# Patient Record
Sex: Female | Born: 1990 | Race: White | Hispanic: No | Marital: Single
Health system: Southern US, Community
[De-identification: ages and names within clinical notes are randomized; demographics above are authoritative.]

## PROBLEM LIST (undated history)

## (undated) DIAGNOSIS — Z87820 Personal history of traumatic brain injury: Secondary | ICD-10-CM

## (undated) DIAGNOSIS — H509 Unspecified strabismus: Secondary | ICD-10-CM

## (undated) HISTORY — DX: Unspecified strabismus: H50.9

## (undated) HISTORY — PX: STRABISMUS CORRECTION, SURGICAL: SHX001610

## (undated) HISTORY — PX: EXTRACTION, TOOTH: SHX000660

## (undated) HISTORY — DX: Personal history of traumatic brain injury: Z87.820

---
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
## 2018-06-02 ENCOUNTER — Encounter: Payer: Self-pay | Admitting: FAMILY PRACTICE

## 2018-06-02 NOTE — Progress Notes (Signed)
Called patient to initiate Pre Visit Planning.  Three patient identifiers used.  Confirmed patient demographics, added e-mail address. Care everywhere query ran, results received no.  Patient will bring prior PCP information at first appointment if unable to access information through Care everywhere. SOGI, REAL, SBRIT completed verbally over the phone. Medication reconciliation completed and allergies verified.  Preferred pharmacy entered into chart. Patient would like to sign up for [facility] yes. Patient agrees to arrive 20 minutes early to complete new patient questionnaire.      Kinsey Cowsert, LVN

## 2018-06-03 ENCOUNTER — Ambulatory Visit: Payer: 59 | Attending: Family Medicine | Admitting: FAMILY PRACTICE

## 2018-06-03 ENCOUNTER — Ambulatory Visit: Payer: 59

## 2018-06-03 ENCOUNTER — Encounter: Payer: Self-pay | Admitting: FAMILY PRACTICE

## 2018-06-03 VITALS — BP 124/73 | HR 62 | Temp 98.6°F | Resp 16 | Ht 66.5 in | Wt 141.0 lb

## 2018-06-03 DIAGNOSIS — Z23 Encounter for immunization: Secondary | ICD-10-CM | POA: Insufficient documentation

## 2018-06-03 DIAGNOSIS — F40243 Fear of flying: Secondary | ICD-10-CM | POA: Insufficient documentation

## 2018-06-03 DIAGNOSIS — Z0000 Encounter for general adult medical examination without abnormal findings: Principal | ICD-10-CM | POA: Insufficient documentation

## 2018-06-03 DIAGNOSIS — Z3041 Encounter for surveillance of contraceptive pills: Secondary | ICD-10-CM | POA: Insufficient documentation

## 2018-06-03 DIAGNOSIS — Z7689 Persons encountering health services in other specified circumstances: Secondary | ICD-10-CM

## 2018-06-03 DIAGNOSIS — Z832 Family history of diseases of the blood and blood-forming organs and certain disorders involving the immune mechanism: Secondary | ICD-10-CM | POA: Insufficient documentation

## 2018-06-03 LAB — CBC NO DIFFERENTIAL
HEMATOCRIT: 41.8 % (ref 34.0–46.0)
HEMOGLOBIN: 14.3 g/dL (ref 12.0–16.0)
MCH: 32.6 pg (ref 27.0–33.0)
MCHC: 34.2 % (ref 32.0–36.0)
MCV: 95.2 fL (ref 80.0–100.0)
MPV: 10.5 fL — AB (ref 6.8–10.0)
PLATELET COUNT: 168 10*3/uL (ref 130–400)
RDW: 12.9 % (ref 0.0–14.7)
RED CELL COUNT: 4.39 10*6/uL (ref 3.70–5.50)
WHITE BLOOD CELL COUNT: 5.5 10*3/uL (ref 4.5–11.0)

## 2018-06-03 LAB — COMPREHENSIVE METABOLIC PANEL
ALANINE TRANSFERASE (ALT): 18 U/L (ref 5–54)
ALBUMIN: 3.8 g/dL (ref 3.4–4.8)
ALKALINE PHOSPHATASE (ALP): 35 U/L (ref 35–115)
ASPARTATE TRANSAMINASE (AST): 29 U/L (ref 15–43)
BILIRUBIN TOTAL: 0.8 mg/dL (ref 0.3–1.3)
CALCIUM: 9 mg/dL (ref 8.6–10.5)
CARBON DIOXIDE TOTAL: 27 mmol/L (ref 24–32)
CHLORIDE: 108 mmol/L (ref 95–110)
CREATININE BLOOD: 0.78 mg/dL (ref 0.44–1.27)
GLUCOSE: 107 mg/dL — AB (ref 70–99)
POTASSIUM: 3.4 mmol/L (ref 3.3–5.0)
PROTEIN: 6.9 g/dL (ref 6.3–8.3)
SODIUM: 142 mmol/L (ref 135–145)
UREA NITROGEN, BLOOD (BUN): 7 mg/dL — AB (ref 8–22)

## 2018-06-03 MED ORDER — LEVONORGESTREL-ETHINYL ESTRADIOL 0.1 MG-20 MCG TABLET
1.0000 | ORAL_TABLET | Freq: Every day | ORAL | 12 refills | Status: DC
Start: 2018-06-03 — End: 2019-06-05

## 2018-06-03 MED ORDER — ALPRAZOLAM 1 MG TABLET
1.0000 mg | ORAL_TABLET | Freq: Three times a day (TID) | ORAL | 3 refills | Status: DC | PRN
Start: 2018-06-03 — End: 2019-02-27

## 2018-06-03 NOTE — Progress Notes (Signed)
CURES Audit Trail         User Date Status   RAYMOND, CAITLIN  Traven Davids C 06/03/2018  1:18 PM  06/03/2018  1:33 PM Reviewed PDMP [1]  Reviewed PDMP [1]             Prior to prescribing the scheduled med, the CURES database was reviewed by me.    Reviewed note, discussed care, and examined patient with Dr. Marcy Salvo.  Developed plan together and agree with evaluation, assessment, and plan documented in the note.    Andres Ege, MD  Health Sciences Clinical Professor  [facility]  PI # [phone], pgr. # [id number]

## 2018-06-03 NOTE — Nursing Note (Signed)
The Influenza Vaccine VIS document for the flu injection was given to patient to review. Patient or person named in permission has answered no to any history of egg allergy, previous serious reaction to a influenza vaccine or current illness which would preclude them receiving an immunization. Any questions were referred to the physician. The Influenza Vaccine was then administered per protocol or by Policy 2041 using the standing order from Dr. James Kirk (Academic) or Dr. Kurt Slapnik (Network).   The patient was observed for immediate reactions to the vaccine per protocol. None were observed.   Andersson Larrabee, LVN

## 2018-06-03 NOTE — Progress Notes (Signed)
Precepted with Dr. Juel Burrow.    Chief Complaint: establish care    History of Present Illness:  Megan Russell is a 27yr old female presenting to establish care.     Patient recently moved for a job with [employer]. Has a masters in public administration. Lives with Boyfriend at home with pets, no children. She is a runner and frequently runs marathons. She reports that her diet is "trash", but she does report drinking enough water. She denies all substance use including, tobacco, alcohol, marijuana, etc. She reports never using substances. She is sexually active with her boyfriend.     She reports no major medical history. In high school she played soccer and had several concussions. She also reports having eye surgery for strabismus at age 27, and having her wisdom teeth out, but no other surgical history. No complications. She denies chronic medical problems, including allergies, asthma, skin problems, cardiac or respiratory problems, GI problems. 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 She's been in therapy for this in the past and would like to try therapy again.     Her current medications are Sronyx, her birth control, and alprazolam, which she takes for flying (takes 1mg  when flying and travels regularly for work). Her menses are regular, no breakthrough bleeding on current birth control.     She has seen a doctor regularly and reports being UTD on pap smears. She is not sure if she is UTD on her Tdap, but does request a flu shot today.     Family history includes a blood disease in her father for which he had his spleen removed. She thinks this is a problem with hemoglobin, and notes that her dad has his blood removed on a regular. She was tested for this in elementary school but was negative.     Review of Systems   All other systems reviewed and are negative.    Past Medical History:   Diagnosis Date    History of concussion     Strabismus      Past Surgical History:   Procedure Laterality Date     EXTRACTION, TOOTH Bilateral     Wisdom teeth removed    STRABISMUS CORRECTION, SURGICAL Right        Social History     Tobacco Use    Smoking status: Never Smoker    Smokeless tobacco: Never Used   Substance Use Topics    Alcohol use: Never     Frequency: Never        Current Outpatient Medications   Medication Sig    Alprazolam (XANAX) 1 mg Tablet Take 1 tablet by mouth three times daily if needed for anxiety.    Levonorgestrel 0.1 mg/Ethinyl Estradiol 20 mcg (SRONYX) Tablet Take 1 tablet by mouth every day.     No current facility-administered medications for this visit.         Allergies as of 06/03/2018    (No Known Allergies)        OBJECTIVE:   BP 124/73 (SITE: right arm, Orthostatic Position: sitting, Cuff Size: regular)   Pulse 62   Temp 37 C (98.6 F) (Oral)   Resp 16   Ht 1.689 m (5' 6.5")   Wt 64 kg (141 lb)   LMP 05/20/2018 (Exact Date)   SpO2 98%   BMI 22.42 kg/m    General: Overall well-appearing, NAD.   HEENT: EOMI. PERRL. MMM.  Neck: Trachea midline, Supple, no masses.   CV: Regular  rate and rhythm. Normal S1, S2. No murmurs, rubs, gallops.  Resp: Normal respiratory effort. CTAB, without wheezes or rhonchi.    GI: + BS. Soft. NTND No pulsatile masses.  No abdominal bruits. No hepatosplenomegaly on palpation.   Extremities: Warm and well perfused. No edema.  Skin: No rashes or ulcerations visible on face, torso, abdomen, or extremities.   Neuro: CN II-XII intact. Strength and sensation to light touch intact grossly throughout.  Mental Status: Alert and oriented x 4.  Linear thought process. Judgement intact. Affect normal. Behavior normal.     ASSESSMENT and PLAN:  1. Establish care  2. Routine health maintenance  No chronic medical issues to address today, other than her anxiety while flying (see below). Her medications are appropriate and I gave her refills of them today. I will have her return to clinic in 1 month for an annual physical exam when her records from prior clinics are  in and I can better assess what immunizations and screenings she needs. Given her potential family history of hemochromatosis I will order a CBC and CMP today to assess baseline.   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 Current regimen is reasonable and I encouraged her to follow up with a local therapist. Instructions for finding therapy provided in AVS.   - Patient to call insurance for therapy  - Alprazolam 15 tablets 3 refills    I reviewed the patient's past medical and family/social history, and updated in chart.  Barriers to Learning assessed: none. Patient verbalizes understanding of teaching and instructions.  Education Needs Identified?  Yes- as above      I spent 40 minutes with the patient, >50% of which were spent counseling her on diagnostic results/impressions, recommendations for further studies, treatment plan and risks/benefits of management, follow-up and return precautions, importance of compliance with chosen management options and risk factor reduction, as well as answering patient questions.    Alleen Borne, MD/PhD  Family Medicine, PGY-1  [facility]  Pager: [phone]

## 2018-06-03 NOTE — Nursing Note (Signed)
Reviewed all orders with the patient including lab work, tests, referrals and/or medications ordered.   Per provider, follow up: Needed  Appointment: Scheduled  AVS: Printed for patient in clinic  Spent 2 minutes discharging patient.     Creston Klas, MA

## 2018-06-03 NOTE — Nursing Note (Signed)
Temp: 37 C (98.6 F) (10/18 1251)  Temp src: Oral (10/18 1251)  Pulse: 62 (10/18 1251)  BP: 124/73 (10/18 1251)  Resp: 16 (10/18 1251)  SpO2: 98 % (10/18 1251)  Height: 168.9 cm (5' 6.5") (10/18 1251)  Weight: 64 kg (141 lb) (10/18 1251)    Vital signs taken, chief complaint noted, allergies verified and screened for pain. Mobility screened for patients over the age of 16. Tobacco use reviewed and preferred pharmacy verified. Medication reconciliation initiated with printed list of current medications to be reviewed and edited by the patient.     Lavell Luster, MA  Family Medicine Clinic, [facility]  [phone]

## 2018-06-03 NOTE — Patient Instructions (Signed)
Patient instructions for mental health (private insurance, also mild-moderate severity mental illness with Medi-Cal)  Please call the phone number on the back of your insurance card to request mental health services (psychiatry/medications and/or therapy/counseling). You can cross-reference the names you receive with people on [email or web address] (a non-commercial website where providers can self-identify areas of specialty, demographics, insurance, etc.). Plan to call multiple providers to find people who are accepting new patients or who can see you in a timely manner. It is common for psychiatrists (medical doctors who prescribe medication) to not be able to see patients for several weeks to months due to limited availability. If you find someone who can get you scheduled, even if it is for weeks to months later, we recommend you schedule an appointment with a plan to cancel the appointment if you are able to find another option sooner. Therapists have widely varying availability--some may be available within a week, others may not be able to schedule for several weeks. Many of these providers (both therapists and psychiatrists) who you call may not call you back, so be prepared to call and leave messages for multiple providers.

## 2018-06-08 ENCOUNTER — Ambulatory Visit: Payer: 59 | Attending: FAMILY PRACTICE | Admitting: FAMILY PRACTICE

## 2018-06-08 VITALS — BP 121/84 | HR 71 | Temp 97.4°F | Resp 13 | Ht 66.0 in | Wt 137.0 lb

## 2018-06-08 DIAGNOSIS — R0981 Nasal congestion: Principal | ICD-10-CM | POA: Insufficient documentation

## 2018-06-08 DIAGNOSIS — J0111 Acute recurrent frontal sinusitis: Secondary | ICD-10-CM

## 2018-06-08 MED ORDER — AMOXICILLIN 875 MG-POTASSIUM CLAVULANATE 125 MG TABLET
1.0000 | ORAL_TABLET | Freq: Two times a day (BID) | ORAL | 0 refills | Status: AC
Start: 2018-06-08 — End: 2018-06-15

## 2018-06-08 MED ORDER — FLUTICASONE PROPIONATE 50 MCG/ACTUATION NASAL SPRAY,SUSPENSION
1.0000 | Freq: Every day | NASAL | 5 refills | Status: AC
Start: 2018-06-08 — End: 2019-07-17

## 2018-06-08 MED ORDER — CETIRIZINE 10 MG TABLET
10.0000 mg | ORAL_TABLET | Freq: Every day | ORAL | 5 refills | Status: AC | PRN
Start: 2018-06-08 — End: 2019-07-17

## 2018-06-08 NOTE — Progress Notes (Signed)
Precepted with Dr. Hetty Blend.    Chief Complaint: sinus congestion    History of Present Illness:  Megan Russell is a 27yr old female with no significant PMH presenting with sinus congestion.     Since Sunday she has had sinus congestion and pressure, combined with facial pain and tooth pain. She notes that she typically has these symptoms in the fall and spring. She's tried sudafed and zyrtec, but without much difference. She did have a sore throat on Sunday, but this has resolved now. No fevers or chills, but the pain keeps her awake at night. Notably, she is feeling fatigued to the point that she was sent home from work today.       Review of Systems   All other systems reviewed and are negative.      There are no active problems to display for this patient.      Past Medical History:   Diagnosis Date    History of concussion     Strabismus         No family history on file.     Social History     Tobacco Use    Smoking status: Never Smoker    Smokeless tobacco: Never Used   Substance Use Topics    Alcohol use: Never     Frequency: Never        Current Outpatient Medications   Medication Sig    Alprazolam (XANAX) 1 mg Tablet Take 1 tablet by mouth three times daily if needed for anxiety.    Levonorgestrel 0.1 mg/Ethinyl Estradiol 20 mcg (SRONYX) Tablet Take 1 tablet by mouth every day.     No current facility-administered medications for this visit.         Allergies as of 06/08/2018    (No Known Allergies)        OBJECTIVE:   BP 121/84 (SITE: left arm, Orthostatic Position: sitting, Cuff Size: regular)   Pulse 71   Temp 36.3 C (97.4 F)   Resp 13   Ht 1.676 m (5\' 6" )   Wt 62.1 kg (137 lb)   LMP 05/20/2018 (Exact Date)   SpO2 98%   BMI 22.11 kg/m    General: Overall well-appearing, laying in bed, NAD.   HEENT: EOMI. PERRL. MMM. Nasal turbinates pale with mucous in both nasal passages. Oropharynx erythematous with active post nasal drip. Tympanic membranes grey, transparent, and in neutral position  bilaterally. Facial tenderness elicited along the frontal sinuses bilaterally to percussion.   Neck: Trachea midline, Supple, no JVD, No carotid bruits, No lymphadenopathy, thyroid not enlarged, no masses.   CV: Regular rate and rhythm. Normal S1, S2. No murmurs, rubs, gallops.  Resp: Normal respiratory effort. CTAB, without wheezes or rhonchi.    GI: + BS. Soft. NTND No pulsatile masses.  No abdominal bruits. No hepatosplenomegaly on palpation.   Extremities: Warm and well perfused. No edema. Radial pulses 2+ bilaterally. DP pulses 2+ bilaterally.   Skin: No rashes or ulcerations visible on face, torso, abdomen, or extremities.   Neuro: CN II-XII intact. Strength and sensation to light touch intact grossly throughout.  Mental Status: Alert and oriented x 4.  Linear thought process. Judgement intact. Affect normal. Behavior normal.     ASSESSMENT and PLAN:  1. Sinus congestion  Consistent with a sinus infection. No fever, and symptoms have only been present for 4 days, but I am strongly inclined to treat with antibiotics given the severity of pain. Differential does include allergies and  viral sinusitis as well as bacterial sinusitis, or some combination thereof. Patient does endorse a history of allergies so I will treat for this as well.   - Flonase 1-2 sprays in each nostril daily  - Zyrtec 10mg  daily  - Augmentin 875mg  BID for 7 days  - F/u if symptoms do not improve    I did review patient's past medical and family/social history, no changes noted.  Barriers to Learning assessed: none. Patient verbalizes understanding of teaching and instructions.  Education Needs Identified?  Yes- as above    I spent 30 minutes with the patient, >50% of which were spent counseling her on diagnostic results/impressions, recommendations for further studies, treatment plan and risks/benefits of management, follow-up and return precautions, importance of compliance with chosen management options and risk factor reduction, as well  as answering patient questions.    Alleen Borne, MD/PhD  Family Medicine, PGY-1  [facility]  Pager: [phone]

## 2018-06-08 NOTE — Patient Instructions (Addendum)
Sinusitis: Care Instructions  Your Care Instructions     Sinusitis is an infection of the lining of the sinus cavities in your head. Sinusitis often follows a cold. It causes pain and pressure in your head and face.  In most cases, sinusitis gets better on its own in 1 to 2 weeks. But some mild symptoms may last for several weeks. Sometimes antibiotics are needed.  Follow-up care is a key part of your treatment and safety. Be sure to make and go to all appointments, and call your doctor if you are having problems. It's also a good idea to know your test results and keep a list of the medicines you take.  How can you care for yourself at home?   Take an over-the-counter pain medicine, such as acetaminophen (Tylenol), ibuprofen (Advil, Motrin), or naproxen (Aleve). Read and follow all instructions on the label.   If the doctor prescribed antibiotics, take them as directed. Do not stop taking them just because you feel better. You need to take the full course of antibiotics.   Be careful when taking over-the-counter cold or flu medicines and Tylenol at the same time. Many of these medicines have acetaminophen, which is Tylenol. Read the labels to make sure that you are not taking more than the recommended dose. Too much acetaminophen (Tylenol) can be harmful.   Breathe warm, moist air from a steamy shower, a hot bath, or a sink filled with hot water. Avoid cold, dry air. Using a humidifier in your home may help. Follow the directions for cleaning the machine.   Use saline (saltwater) nasal washes to help keep your nasal passages open and wash out mucus and bacteria. You can buy saline nose drops at a grocery store or drugstore. Or you can make your own at home by adding 1 teaspoon of salt and 1 teaspoon of baking soda to 2 cups of distilled water. If you make your own, fill a bulb syringe with the solution, insert the tip into your nostril, and squeeze gently. Blow your nose.   Put a hot, wet towel or a warm  gel pack on your face 3 or 4 times a day for 5 to 10 minutes each time.   Try a decongestant nasal spray like oxymetazoline (Afrin). Do not use it for more than 3 days in a row. Using it for more than 3 days can make your congestion worse.  When should you call for help?  Call your doctor now or seek immediate medical care if:   You have new or worse swelling or redness in your face or around your eyes.   You have a new or higher fever.  Watch closely for changes in your health, and be sure to contact your doctor if:   You have new or worse facial pain.   The mucus from your nose becomes thicker (like pus) or has new blood in it.   You are not getting better as expected.   Where can you learn more?   Go to https://www.healthwise.net/patiented  Enter I933 in the search box to learn more about "Sinusitis: Care Instructions."    2006-2015 [employer]. Care instructions adapted under license by [facility]. This care instruction is for use with your licensed healthcare professional. If you have questions about a medical condition or this instruction, always ask your healthcare professional. Healthwise, Incorporated disclaims any warranty or liability for your use of this information.  Content Version: 10.6.465758; Current as of: June 30, 2013            For same day urgent needs: please be aware during business hours Monday through Friday from 8am to 5pm we encourage you to contact us at [phone]   to schedule an appointment. If we are unable to see you, you may also have access to an advice nurse 24 hours a day and urgent care services through   your insurance provider. If you have an HMO we will work with the urgent care center you go to for PCP authorization and if you have any other kind of insurance   you can go straight to an urgent care center without PCP authorization. Please call or go on-line with your insurance provider and ask about their advice nurse services   and  to get a list of authorized Urgent Care centers.     After business hours, evenings, and weekends you may call our clinic [phone] and the hospital operator will contact our department's on call doctor to   speak with you. You can also use your insurance provider advice nurse services or go to one of your insurance providers authorized urgent care centers.     Call 911 or go to the nearest Emergency room for life threatening emergencies.       Lavell Luster, MA II  Family Medicine Clinic  [phone]

## 2018-06-08 NOTE — Progress Notes (Signed)
This patient was seen, evaluated, and care plan was developed with the resident.  I agree with the assessment and plan as outlined in the resident's note.    Report Electronically Signed by:  Amoree Newlon, MD  Professor  Department of Family & Community Medicine  PI # [id number]  Pager [phone]

## 2018-06-08 NOTE — Nursing Note (Signed)
Temp: 36.3 C (97.4 F) (10/23 1408)  Temp src: --  Pulse: 71 (10/23 1408)  BP: 121/84 (10/23 1408)  Resp: 13 (10/23 1408)  SpO2: 98 % (10/23 1408)  Height: 167.6 cm (5\' 6" ) (10/23 1408)  Weight: 62.1 kg (137 lb) (10/23 1408)    Vital signs taken, chief complaint noted, allergies verified and screened for pain. Mobility screened for patients over the age of 7. Tobacco use reviewed and preferred pharmacy verified. Medication reconciliation initiated with printed list of current medications to be reviewed and edited by the patient.     Vernia Buff, MA  Family Medicine Clinic, [facility]  [phone]

## 2018-06-08 NOTE — Nursing Note (Signed)
Reviewed all orders with the patient including lab work, tests, referrals and/or medications ordered. Pharmacy verified once again. Scheduled a follow up appointment. Printed and gave AVS to patient. Spent 1 minutes discharging patient.     Vanesha Athens, MA II  Family Medicine Clinic  [phone]

## 2018-06-09 MED FILL — flu vaccine qs 2019-20(6mos up)(PF) 60 mcg(15 mcgx4)/0.5 mL IM syringe: INTRAMUSCULAR | Qty: 0.5 | Fill #0 | Status: AC

## 2018-07-05 ENCOUNTER — Ambulatory Visit: Payer: 59 | Attending: Family Medicine

## 2018-07-05 ENCOUNTER — Encounter: Payer: Self-pay | Admitting: FAMILY PRACTICE

## 2018-07-05 ENCOUNTER — Ambulatory Visit: Payer: 59 | Attending: Family Medicine | Admitting: FAMILY PRACTICE

## 2018-07-05 VITALS — BP 120/82 | HR 61 | Temp 97.8°F | Resp 16 | Ht 66.0 in | Wt 137.8 lb

## 2018-07-05 DIAGNOSIS — Z01419 Encounter for gynecological examination (general) (routine) without abnormal findings: Secondary | ICD-10-CM | POA: Insufficient documentation

## 2018-07-05 DIAGNOSIS — J302 Other seasonal allergic rhinitis: Secondary | ICD-10-CM | POA: Insufficient documentation

## 2018-07-05 DIAGNOSIS — Z0000 Encounter for general adult medical examination without abnormal findings: Principal | ICD-10-CM | POA: Insufficient documentation

## 2018-07-05 LAB — HIV AG/AB COMBO SCREEN: HIV AG/AB COMBO SCREEN: NONREACTIVE

## 2018-07-05 NOTE — Nursing Note (Signed)
Set up gyn tray and assisted provider with pelvic exam.  Obtained specimens for PAP and GC/Chlamydia. Specimens sent to lab.

## 2018-07-05 NOTE — Nursing Note (Signed)
After visit summary was print out for pt. Went over encounter summary with patient made sure if any fasting labs (ABN that needed to be ran), medication refills, referrals and radiology/ultrasound made sure patient was aware of it. Spent a total of 5 minutes with patient. Jaylon Grode MA II

## 2018-07-05 NOTE — Progress Notes (Signed)
Precepted with Dr. Johna SheriffSharon George.    Chief Complaint: annual physical    History of Present Illness:  Megan Russell is a [age]yr old female with PMH of seasonal allergies otherwise in good health presenting for her annual physical exam. Megan Russell recently transferred care to this facility and records from her old provider do not indicate the date of her last pap smear. She had her flu shot on 06/03/18 at this facility.     Patient states that her last pap smear was in March or April of this year and it was negative. She believes she had the HPV vaccine when she was an adolescent. She is unclear on whether her insurance will cover a second pap smear this year, but she would prefer to have a pap smear done today.     She agrees to STI screening. She has no other complaints.     Review of Systems   All other systems reviewed and are negative.      Patient Active Problem List    Diagnosis Date Noted    Seasonal allergies 07/05/2018       Past Medical History:   Diagnosis Date    History of concussion     Strabismus         No family history on file.     Social History     Tobacco Use    Smoking status: Never Smoker    Smokeless tobacco: Never Used   Substance Use Topics    Alcohol use: Never     Frequency: Never        Current Outpatient Medications   Medication Sig    Cetirizine (ZYRTEC) 10 mg Tablet Take 1 tablet by mouth once daily if needed for Allergies.    Fluticasone (FLONASE) 50 mcg/actuation nasal spray Instill 1-2 sprays into EACH nostril every day. (allergies)    Levonorgestrel 0.1 mg/Ethinyl Estradiol 20 mcg (SRONYX) Tablet Take 1 tablet by mouth every day.     No current facility-administered medications for this visit.         Allergies as of 07/05/2018    (No Known Allergies)        OBJECTIVE:   BP 120/82 (SITE: right arm, Orthostatic Position: sitting, Cuff Size: regular)   Pulse 61   Temp 36.6 C (97.8 F) (Tympanic)   Resp 16   Ht 1.676 m (5\' 6" )   Wt 62.5 kg (137 lb 12.8 oz)   LMP 07/04/2018    SpO2 97%   BMI 22.24 kg/m    General: Overall well-appearing, laying in bed, NAD.   HEENT: EOMI. PERRL. MMM.  Neck: Trachea midline, Supple, no JVD, No carotid bruits, No lymphadenopathy, thyroid not enlarged, no masses.   CV: Regular rate and rhythm. Normal S1, S2. No murmurs, rubs, gallops.  Resp: Normal respiratory effort. CTAB, without wheezes or rhonchi.    GI: + BS. Soft. NTND. No pulsatile masses.  No abdominal bruits. No hepatosplenomegaly on palpation.   GU:    External genitalia: normal   Uterus: firm, non tender   Adnexa: no masses, non-tender   Cervix: smooth without lesions, a small amount of blood a mucous present.    Vagina: vaginal walls without lesions or erythema, scattered menstrual blood and mucous.   Extremities: Warm and well perfused. No edema.   Skin: No rashes or ulcerations visible on face, torso, abdomen, or extremities.   Neuro: CN II-XII intact. Strength and sensation to light touch intact grossly throughout.  Mental Status:  Alert and oriented x 4.  Linear thought process. Judgement intact. Affect normal. Behavior normal.     ASSESSMENT and PLAN:  Megan Russell is a 27 yoF in good health with PMH of seaonal allergies presenting for her annual physical exam.     #(Z00.00) Routine health maintenance  (primary encounter diagnosis)  Comment: Healthy 27 year old in need of pap smear, UTD on vaccinations, agrees to STI screening. No need for colonoscopy or mammogram, blood pressure screening wnl. Prior PHQ9 was 0 and today's GAD7 was 7.  Encouraged patient to follow up with therapy.   Plan:   - pap smear with gyn cytology, no HPV co-testing  - HIV screening  - syphilis screening  - chlamydia/gonorrhea screening  - f/u with therapist  - F/u in 1 year for annual physical exam    I did review patient's past medical and family/social history, no changes noted.  Barriers to Learning assessed: none. Patient verbalizes understanding of teaching and instructions.  Education Needs Identified?  Yes-  as above    I spent 30 minutes with the patient, >50% of which were spent counseling her on diagnostic results/impressions, recommendations for further studies, treatment plan and risks/benefits of management, follow-up and return precautions, importance of compliance with chosen management options and risk factor reduction, as well as answering patient questions.    Alleen Borne, MD/PhD  Family Medicine, PGY-1  [facility]  Pager: [phone]

## 2018-07-05 NOTE — Progress Notes (Signed)
This patient was seen, evaluated, and care plan was developed with the resident.  I agree with the assessment and plan as outlined in the resident's note.  Report electronically signed by Aziza Stuckert McCoy Yittel Emrich, MD. Attending

## 2018-07-05 NOTE — Nursing Note (Signed)
Temp: 36.6 C (97.8 F) (11/19 1504)  Temp src: Tympanic (11/19 1504)  Pulse: 61 (11/19 1504)  BP: 120/82 (11/19 1504)  Resp: 16 (11/19 1504)  SpO2: 97 % (11/19 1504)  Height: 167.6 cm (5\' 6" ) (11/19 1504)  Weight: 62.5 kg (137 lb 12.8 oz) (11/19 1504)    Vital signs taken, chief complaint noted, allergies verified and screened for pain. Mobility screened for patients over the age of 28. Tobacco use reviewed and preferred pharmacy verified. Medication reconciliation initiated with printed list of current medications to be reviewed and edited by the patient.     Wayland SalinasJaskaran Laylee Schooley, MA  Family Medicine Clinic, [facility]  [phone]

## 2018-07-06 LAB — SYPHILIS IGG/IGM AB WITH REFLEX: SYPHILIS IGG/IGM AB WITH REFLEX: NONREACTIVE

## 2018-07-07 LAB — CHLAMYDIA/GONORRHOEAE SWAB
CHLAMYDIA TRACHOMATIS SWAB: NEGATIVE
NEISSERIA GONORRHOEAE SWAB: NEGATIVE

## 2018-07-11 LAB — CYTOLOGY GYN
~~LOC~~ AP GYN INTERPRETATION: NEGATIVE
~~LOC~~ AP GYN SPECIMEN ADEQUACY: ABSENT

## 2018-07-12 ENCOUNTER — Encounter: Payer: Self-pay | Admitting: FAMILY PRACTICE

## 2018-07-13 ENCOUNTER — Telehealth: Payer: Self-pay | Admitting: FAMILY PRACTICE

## 2018-07-13 NOTE — Telephone Encounter (Addendum)
Called patient to ask that she have her old records sent.    Megan Borneaitlin Katalia Choma, MD/PhD  Family Medicine, PGY-1  [facility]  Pager: [phone]      ----- Message from Houston SirenSharon McCoy George, MD sent at 07/12/2018 10:00 AM PST -----  It would be useful to get her prior pap result from her previous doctor since the endocervical cells aren't present on this one.

## 2018-11-16 ENCOUNTER — Ambulatory Visit: Payer: 59 | Admitting: Student in an Organized Health Care Education/Training Program

## 2018-11-16 DIAGNOSIS — L539 Erythematous condition, unspecified: Secondary | ICD-10-CM

## 2018-11-16 DIAGNOSIS — L301 Dyshidrosis [pompholyx]: Secondary | ICD-10-CM

## 2018-11-16 DIAGNOSIS — R21 Rash and other nonspecific skin eruption: Secondary | ICD-10-CM

## 2018-11-16 DIAGNOSIS — Z808 Family history of malignant neoplasm of other organs or systems: Secondary | ICD-10-CM

## 2018-11-16 MED ORDER — TRIAMCINOLONE ACETONIDE 0.1 % TOPICAL CREAM
30.0000 g | TOPICAL_CREAM | Freq: Two times a day (BID) | TOPICAL | 2 refills | Status: AC
Start: 2018-11-16 — End: 2019-07-17

## 2018-11-16 NOTE — Video Visit Notes (Signed)
Video Visit Note     I performed this clinical encounter by utilizing a real time telehealth video connection between my location and the patient's location. The patient's location was confirmed during this visit. I obtained verbal consent from the patient to perform this clinical encounter utilizing video and prepared the patient by answering any questions they had about the telehealth interaction.    CHIEF COMPLAINT: Rash    SUBJECTIVE: Backs of hands bilaterally    Not itchy but feels very sensitive    Bumps   When she gets back from runnign seems a lot redder than usual   Had it since last Friday   No new meds   Has not been in the sun more than usual   Has been taking benadryl QHS, did not help    Aloe lotion has not helped   Neosporin has not helped   Also has a red spot on her nose, not sure if related     Dad has h/o skin cancer, she requests referral to dermatology     OBJECTIVE: bilateral dorsa of hands with small flesh-colored papules. No apparent erythema, edema, or signs of infection.     ASSESSMENT & PLAN:   Megan Russell is a 28yr old female with no significant medical history who presents for rash on dorsa of bilateral hands, features consistent with dyshidrotic eczema.     (L30.1) Dyshidrotic eczema  (primary encounter diagnosis)  Comment: Features consistent with above, likely due to frequent handwashing in setting of COVID pandemic.   Plan: Triamcinolone (KENALOG) 0.1 % Cream        - use hand sanitizer when possible        - Kenalog cream for relief BID     (L53.9) Facial erythema  Comment: localized to nose. Patient is concerned given family history of skin cancer and requests referral to [facility] clinic.  Plan: DERMATOLOGY CLINIC REFERRAL        - referred per patient request        Approximately 15 minutes were spent with the patient, of which more than 50% of the time was spent in counseling and/or coordinating care on dermatidities. The patient understands and agrees with the plan of  care outlined.

## 2018-11-17 NOTE — Progress Notes (Signed)
This patient was seen by the resident.  I reviewed and agree with the resident's assessment and plan as outlined in the resident's note.  Report electronically signed by Huxley Vanwagoner Po-An Huang Kimie Pidcock, MD. Attending

## 2019-02-20 ENCOUNTER — Telehealth: Payer: Self-pay | Admitting: Family Medicine

## 2019-02-20 NOTE — Telephone Encounter (Signed)
Megan Russell is a 28yr old female  3 patient identifiers used.  Per:   patient.      ClearTriage Note: Run this morning and felt sick without warning and vomiting. Continues to vomit and has had diarrhea.. Diarrhea started on Friday after eating some pizza that upsets her stomach. No mucus or blood in diarrhea. No other COVID symptoms.     Protocol Used: Diarrhea (Adult)  Protocol-Based Disposition: Home Care    Positive Triage Question:  * MILD-MODERATE diarrhea (e.g., 1-6 times / day more than normal)    Care Advice Discussed:  * Diarrhea Medicine - Bismuth Subsalicylate (e.g., Kaopectate, Pepto-Bismol)      - This medicine can help reduce diarrhea, vomiting, and abdominal cramping. It is available over-the-counter (OTC) in a drug store.      - Adult dosage: Take two tablets or two tablespoons by mouth every hour (if diarrhea continues) to a maximum of 8 doses in a 24 hour period.      - Do not use for more than 2 days.  * Fluid Therapy during Mild-Moderate Diarrhea      - Drink more fluids, at least 8-10 cups daily. One cup equals 8 oz (240 ml).      - Water: For mild to moderate diarrhea, water is often the best liquid to drink. You should also eat some salty foods (e.g., potato chips, pretzels, saltine crackers). This is important to make sure you are getting enough salt, sugars, and fluids to meet your body's needs.      - Sports drinks: You can also drink a sports drinks (e.g., Gatorade, Powerade) to help treat and prevent dehydration. For it to work best, mix it half and half with water.      - Avoid caffeinated beverages (Reason: caffeine is mildly dehydrating).      - Avoid alcohol beverages (beer, wine, hard liquor).  * Food and Nutrition during Mild-Moderate Diarrhea      - Maintaining some food intake during episodes of diarrhea is important.      - Begin with boiled starches / cereals (e.g., potatoes, rice, noodles, wheat, oats) with a small amount of salt to taste.      - Other foods that are OK  include: bananas, yogurt, crackers, soup.      - As the diarrhea starts to get better, you can slowly return to a normal diet.  * Expected Course      - Viral diarrhea lasts 4-7 days. Always worse on days 1 and 2.  * Contagiousness      - Be certain to wash your hands after using the restroom.      - If your work is cooking, Research scientist (medical), serving or preparing food, then you should not work until the diarrhea has completely stopped.  * Reassurance and Education      - Sometimes the cause is an infection caused by a virus ('stomach flu) or a bacteria. Diarrhea is one of the body's way of getting rid of germs.      - Certain foods (e.g., dairy products, supplements like Ensure) can also trigger diarrhea.      - In some patients, the exact cause is never found.      - Staying well-hydrated is the key for adults with diarrhea. From what you have told me, it sounds like you are not severely dehydrated at this point.      - Here is some general care advice that should help.  *  Diarrhea Medicine - Loperamide (Imodium AD)      - This medicine helps decrease diarrhea. It is available over-the-counter (OTC) in a drug store.      - Adult dosage: 4 mg (2 capsules) is the recommended first dose. You may take an additional 2 mg (1 capsule) after each loose BM.      - Maximum dosage: 16 mg per day (8 capsules).      - Do not use for more than 2 days.  * Reasons To Call Back      - Signs of dehydration occur (e.g., no urine over 12 hours, very dry mouth, lightheaded, etc.)      - Diarrhea lasts over 7 days      - You become worse.    Negative Triage Questions:  * Shock suspected (e.g., cold/pale/clammy skin, too weak to stand, low BP, rapid pulse)  * Difficult to awaken or acting confused (e.g., disoriented, slurred speech)  * Sounds like a life-threatening emergency to the triager  * [1] SEVERE abdominal pain (e.g., excruciating) AND [2] present > 1 hour  * [1] SEVERE abdominal pain AND [2] age > 3760  * [1] Blood in the stool AND [2]  moderate or large amount of blood  * Black or tarry bowel movements  (Exception: chronic-unchanged  black-grey bowel movements AND is taking iron pills or Pepto-Bismol)  * [1] Drinking very little AND [2] dehydration suspected (e.g., no urine > 12 hours, very dry mouth, very lightheaded)  * Patient sounds very sick or weak to the triager  * [1] SEVERE diarrhea (e.g., 7 or more times / day more than normal) AND [2] age > 60 years  * [1] Constant abdominal pain AND [2] present > 2 hours  * [1] Fever > 103 F (39.4 C) AND [2] not able to get the fever down using Fever Care Advice  * [1] SEVERE diarrhea (e.g., 7 or more times / day more than normal) AND [2] present > 24 hours (1 day)  * [1] MODERATE diarrhea (e.g., 4-6 times / day more than normal) AND [2] present > 48 hours (2 days)  * [1] MODERATE diarrhea (e.g., 4-6 times / day more than normal) AND [2] age > 70 years  * Fever > 101 F (38.3 C)  * Fever present > 3 days (72 hours)  * Abdominal pain  (Exception: Pain clears with each passage of diarrhea stool)  * [1] Blood in the stool AND [2] small amount of blood    (Exception: only on toilet paper. Reason: diarrhea can cause rectal irritation with blood on wiping)  * [1] Mucus or pus in stool AND [2] present > 2 days AND [3] diarrhea is more than mild  * [1] Recent antibiotic therapy (i.e., within last 2 months) AND [2] diarrhea present > 3 days since antibiotic was stopped  * [1] Recent hospitalization AND [2] diarrhea present > 3 days  * Weak immune system (e.g., HIV positive, cancer chemo, splenectomy, organ transplant, chronic steroids)  * Tube feedings (e.g., nasogastric, g-tube, j-tube)  * Travel to a foreign country in past month    Disposition: Advice given per protocol  Per:   patient verbalizes agreement to plan. Agrees to callback with any increase in symptoms/concerns or questions.     Dewayne ShorterJillian Alleene Stoy, RN  PCN Triage

## 2019-02-27 ENCOUNTER — Other Ambulatory Visit: Payer: Self-pay | Admitting: Family Medicine

## 2019-02-27 DIAGNOSIS — F419 Anxiety disorder, unspecified: Secondary | ICD-10-CM

## 2019-02-27 NOTE — Telephone Encounter (Signed)
Patient is requesting the alprazolam 1 mg ORAL THREE TIMES DAILY PRN   Patient states she will need medication filled prior to Wednesday as she will be leaving going out of town that morning.    [place] / Referral Coordinator  [phone]

## 2019-04-03 ENCOUNTER — Encounter: Payer: Self-pay | Admitting: Family Medicine

## 2019-04-03 ENCOUNTER — Other Ambulatory Visit: Payer: Self-pay | Admitting: Family Medicine

## 2019-04-03 DIAGNOSIS — Z411 Encounter for cosmetic surgery: Secondary | ICD-10-CM

## 2019-04-03 NOTE — Telephone Encounter (Signed)
From: Vladimir Faster  To: Rosezetta Schlatter, MD  Sent: 04/03/2019 12:24 PM PDT  Subject: Referral Question    Good afternoon Dr. Jane Canary,    This morning I contacted the [place] [facility] office to make an appointment for a consultation. They let me know that I would need a referral from the primary care office in order to set it up. I'm reaching out to start this process :)

## 2019-04-27 ENCOUNTER — Ambulatory Visit: Payer: Self-pay | Admitting: Ph.D. Medical Genetics"

## 2019-06-04 ENCOUNTER — Other Ambulatory Visit: Payer: Self-pay | Admitting: Family Medicine

## 2019-06-05 NOTE — Telephone Encounter (Signed)
Requested Prescriptions     Pending Prescriptions Disp Refills    Levonorgestrel 0.1 mg/Ethinyl Estradiol 20 mcg (ALESSE, AVIANE, LESSINA) Tablet [Pharmacy Med Name: LEVONOR-ETH ESTRAD 0.1-0.02 MG] 84 tablet 4     Sig: TAKE 1 TABLET BY MOUTH EVERY DAY

## 2019-06-20 ENCOUNTER — Telehealth: Payer: Self-pay | Admitting: Family Medicine

## 2019-06-20 ENCOUNTER — Ambulatory Visit: Payer: 59 | Admitting: Student in an Organized Health Care Education/Training Program

## 2019-06-20 DIAGNOSIS — F410 Panic disorder [episodic paroxysmal anxiety] without agoraphobia: Secondary | ICD-10-CM

## 2019-06-20 DIAGNOSIS — F418 Other specified anxiety disorders: Secondary | ICD-10-CM

## 2019-06-20 DIAGNOSIS — F329 Major depressive disorder, single episode, unspecified: Secondary | ICD-10-CM

## 2019-06-20 DIAGNOSIS — F32A Depression, unspecified: Secondary | ICD-10-CM

## 2019-06-20 DIAGNOSIS — F411 Generalized anxiety disorder: Secondary | ICD-10-CM

## 2019-06-20 MED ORDER — SERTRALINE 50 MG TABLET
50.0000 mg | ORAL_TABLET | Freq: Every day | ORAL | 1 refills | Status: DC
Start: 2019-06-20 — End: 2019-07-12

## 2019-06-20 MED ORDER — SERTRALINE 25 MG TABLET
25.0000 mg | ORAL_TABLET | Freq: Every day | ORAL | 0 refills | Status: DC
Start: 2019-06-20 — End: 2020-04-02

## 2019-06-20 NOTE — Progress Notes (Signed)
Precepted with Dr. Laurey Arrow    Family Medicine Clinic Note    Note: Assessment and plan have been moved to the top of the note for convenience.     ASSESSMENT and PLAN    Megan Russell is a 28yr old female with history of panic disorder who presents for evaluation of increasing generalized anxiety, irritability, anhedonia, emotional lability consistent with GAD and depression.     1. GAD (generalized anxiety disorder)  2. Panic attack  3. Depression, unspecified depression type  Previously provided with xanax by MD in [facility] for air travel related anxiety/panic disorder. Patient now endorsing generalized anxiety, heightened level of worry and preoccupation with personal and professional issues, currently exacerbated by societal/global uncertainty. Additionally, patient with acute increase in insidious onset of anhedonia, irritability, emotional lability, decreased appetite. Patient denies thoughts of self-harm or history of self-harm. Desires to start medication to address daily anxiety and depression symptoms. Plan for close follow up with PCP to assess for reponse.  - Alprazolam (XANAX) 1 mg Tablet; Historical.  - Sertraline (ZOLOFT) 25 mg Tablet; Take 1 tablet by mouth every day for 7 days. (Start 50 mg dose after finishing 25 mg dose.)  Dispense: 7 tablet; Refill: 0  - Sertraline (ZOLOFT) 50 mg Tablet; Take 1 tablet by mouth every day.  Dispense: 30 tablet; Refill: 1      Current Outpatient Medications on File Prior to Visit   Medication Sig Dispense Refill    Alprazolam (XANAX) 1 mg Tablet TAKE 1 TABLET BY MOUTH THREE TIMES DAILY IF NEEDED FOR ANXIETY.      Cetirizine (ZYRTEC) 10 mg Tablet Take 1 tablet by mouth once daily if needed for Allergies. 30 tablet 5    Fluticasone (FLONASE) 50 mcg/actuation nasal spray Instill 1-2 sprays into EACH nostril every day. (allergies) 16 g 5    Levonorgestrel 0.1 mg/Ethinyl Estradiol 20 mcg (ALESSE, AVIANE, LESSINA) Tablet TAKE 1 TABLET BY MOUTH EVERY DAY 84 tablet 4     Triamcinolone (KENALOG) 0.1 % Cream Apply 30 g to the affected area 2 times daily. (rash) 30 g 2     No current facility-administered medications on file prior to visit.          Follow-up:   - 2-4w follow up with PCP  Future Appointments   Date Time Provider Department Center   07/17/2019  9:00 AM Nearing, Leta Jungling, OD [facility]     I did review patient's past medical and family/social history: No changes noted.  Barriers to Learning?: None  Patient was counseled specifically on the following: counseled on prognosis, instructions for management and/or follow-up and risk factor reduction  Patient verbalized understanding of teaching and instructions.  Note provided: no             History of Present Illness:  Megan Russell is a 27yr old female  has a past medical history of History of concussion and Strabismus.  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, civil (consulting) - mapping the program - work is biggest stressor in life    Around flying - sweat through clothes  Panic attacks  Recently flew back to [place] - feels like amnesia  Doesn't remember getting home   Feels like the medication causes her to get angry    Couple years ago -  a number of flights in a row had emergency landings  Feels like its a control issue - goes into a spiral  Feels like this is also happening in her normal life  Wants a medication that allows her to fly but also be a functioning adult    Took 0.5mg  xanax and it doesn't help  Takes 1mg  of alprazolam an hour before the flight and if isnt helping when boarding    Feels like she cant cope and feels too tightly wound  Feels like she could cry everyday  Last several months feels very defeated  Noticing in life feeling less ambitious  Feels like she cant push through    Smallest inconvenience in the grocery store, sets her off  Irritable  Poor appetite   Sleep is  okay  No motivation to do things that she loves   Change in activity    Would like a daily anxiety medication    All systems were reviewed and are negative except as documented in HPI    I did review patient's past medical and family/social history, no changes noted.     OBJECTIVE:   There were no vitals taken for this visit.   Appears well and in no acute distress  Constitutional: well-developed, cooperative  Psychiatric: alert, oriented X 3, appropriate mood and affect; tearful when discussing significance of symptoms  HEENT: no conjunctivitis or scleral icterus  Chest: normal respiratory effort  Skin: no jaundice or pallor    No results found for: HGBA1C  Lab Results   Lab Name Value Date/Time    WBC 5.5 06/03/2018 02:12 PM    HGB 14.3 06/03/2018 02:12 PM    HCT 41.8 06/03/2018 02:12 PM    PLT 168 06/03/2018 02:12 PM          Salvadore Farber, MD  [facility]  PGY-2, [place]  Pager: [phone]  PI: [phone]

## 2019-06-20 NOTE — Patient Instructions (Signed)
Apps for relaxation  Calm  Headspace  One Minute Meditation  Sleepmaker Rain  Relax Sounds   Silva Relax  Stop, Breathe & Think    Mindfulness meditations   [email or web address]    Recommended Books  For depression: Feeling Good by David Burns - pages 1-80 (CBT for depression)  For anxiety: When Panic Attacks by David Burns    The Mindful Way Workbook: An 8-week program to free yourself from depression and emotional distress by Keysean Savino Teasdale and Mark Williams    Recommended website for free online therapy:  [employer] training program (online):   Https://moodgym.anu.edu.au/welcome    Contact your insurance provider or utilize PsychologyToday.com to obtain a counselor.    Cognitive Behavioral Therapy for Anxiety  The Cognitive Behavioral Workbook for Anxiety: A Step-By-Step Program  Jun 17, 2013   by William J. Knaus EdD and Jon Carlson PsyD EdD ABPP    The Anxiety and Worry Workbook: The Cognitive Behavioral Solution  Apr 24, 2010   by David A. Clark PhD and Aaron T. Beck MD

## 2019-06-20 NOTE — Telephone Encounter (Signed)
Hello,    Called patient and left message to call the clinic to schedule.    Sincerely,  Jazlen Ogarro R. Bence Trapp  [facility] III  Family Medicine Clinic  Telephone [phone]

## 2019-06-20 NOTE — Telephone Encounter (Signed)
-----   Message from Toma Aran, MD sent at 06/20/2019  1:34 PM PST -----  Regarding: Scheduling  Please schedule patient for follow up on 11/17 for anxiety and depression, 2w follow up after initiating sertraline. Thank you!

## 2019-06-29 NOTE — Progress Notes (Signed)
This patient was seen and evaluated by the resident via live video feed. The care plan was developed together with the resident. I did not personally see the patient.  I agree with the assessment and plan as outlined in the resident's note.  Report electronically signed by:     Jaymes Hang, MD  Associate Physician Diplomate, Faculty  Family and Community Medicine

## 2019-07-12 ENCOUNTER — Other Ambulatory Visit: Payer: Self-pay | Admitting: FAMILY PRACTICE

## 2019-07-12 DIAGNOSIS — F411 Generalized anxiety disorder: Secondary | ICD-10-CM

## 2019-07-12 DIAGNOSIS — F32A Depression, unspecified: Secondary | ICD-10-CM

## 2019-07-12 DIAGNOSIS — F410 Panic disorder [episodic paroxysmal anxiety] without agoraphobia: Secondary | ICD-10-CM

## 2019-07-12 NOTE — Telephone Encounter (Signed)
Requested Prescriptions     Pending Prescriptions Disp Refills    Sertraline (ZOLOFT) 50 mg Tablet [Pharmacy Med Name: SERTRALINE HCL 50 MG TABLET] 90 tablet 1     Sig: TAKE 1 TABLET BY MOUTH EVERY DAY

## 2019-07-16 ENCOUNTER — Other Ambulatory Visit: Payer: Self-pay | Admitting: Family Medicine

## 2019-07-16 DIAGNOSIS — F410 Panic disorder [episodic paroxysmal anxiety] without agoraphobia: Secondary | ICD-10-CM

## 2019-07-16 DIAGNOSIS — F411 Generalized anxiety disorder: Secondary | ICD-10-CM

## 2019-07-17 ENCOUNTER — Encounter: Payer: Self-pay | Admitting: Family Medicine

## 2019-07-17 ENCOUNTER — Ambulatory Visit: Payer: 59 | Attending: Optometrist | Admitting: Optometrist

## 2019-07-17 DIAGNOSIS — H5202 Hypermetropia, left eye: Secondary | ICD-10-CM | POA: Insufficient documentation

## 2019-07-17 DIAGNOSIS — H5212 Myopia, left eye: Secondary | ICD-10-CM | POA: Insufficient documentation

## 2019-07-17 DIAGNOSIS — F410 Panic disorder [episodic paroxysmal anxiety] without agoraphobia: Secondary | ICD-10-CM

## 2019-07-17 DIAGNOSIS — H469 Unspecified optic neuritis: Secondary | ICD-10-CM

## 2019-07-17 DIAGNOSIS — H53001 Unspecified amblyopia, right eye: Secondary | ICD-10-CM

## 2019-07-17 DIAGNOSIS — F411 Generalized anxiety disorder: Secondary | ICD-10-CM

## 2019-07-17 NOTE — Nursing Note (Signed)
Patient was wearing a surgical mask.  Droplet precautions were followed when caring for the patient.   PPE used by me during encounter: Surgical mask and eye protection     Megan Russell

## 2019-07-17 NOTE — Telephone Encounter (Signed)
Patient on long-term xanax for anxiety Stable dosing, CURES reviewed and appropriate.    Lysbeth Penner, MD PGY-3  [facility]

## 2019-07-17 NOTE — Telephone Encounter (Signed)
Requested Prescriptions     Name from pharmacy: ALPRAZOLAM 1 MG TABLET         Will file in chart as: Alprazolam (XANAX) 1 mg Tablet    Sig: TAKE 1 TABLET BY MOUTH THREE TIMES DAILY IF NEEDED FOR ANXIETY.    Disp:  15 tablet  Refills:  0    Start: 07/16/2019    Class: Pharmacy    For: GAD (generalized anxiety disorder), Panic attack    To pharmacy: Not to exceed 2 additional fills before 08/26/2019    Last ordered: 3 weeks ago by Patient Reported Last refill: 02/27/2019    Rx #: [id number]

## 2019-07-17 NOTE — Progress Notes (Signed)
Chief Complaint   Patient presents with    Eye Exam     28 yo F here for exam and CL update..  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  LEE 4 yrs, notices vision is more blurry.  No eye pain, no flash/float         ELZORA CULLINS is a 28yr old female who is here to establish care.   She complains of a decline in vision OS, squinting more to see. Longstanding poor vision OD  Wears contact lens majority of the time, glasses on weekends. 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 Compound Myopic Astigmat OS   Amblyopia OD  A new spectacle prescription was released to her today/ polycarbonate    2. Good comfort, fit, and visual acuity with  Air Optix Aqua contact lens OS  Monocular precautions  Discussed risks of contact lens wear and the importance of cleaning, replacing as directed, and no EW.  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   PPE used by provider during encounter: Surgical mask      Electronically signed by:      Lavell Islam, [place], [place]   Senior Optometrist    Department of Ophthalmology & [facility]

## 2019-07-17 NOTE — Telephone Encounter (Signed)
Requested Prescriptions     Alprazolam (XANAX) 1 mg Tablet         Sig: Take 1 tablet by mouth three times daily if needed. for anxiety    Disp:  15 tablet  Refills:  0    Start: 07/17/2019 - 08/16/2019    Class: Pharmacy    For: GAD (generalized anxiety disorder), Panic attack    Last ordered: Today by Rosezetta Schlatter, MD

## 2019-09-05 ENCOUNTER — Other Ambulatory Visit: Payer: Self-pay | Admitting: Family Medicine

## 2019-09-05 DIAGNOSIS — F411 Generalized anxiety disorder: Secondary | ICD-10-CM

## 2019-09-05 DIAGNOSIS — F410 Panic disorder [episodic paroxysmal anxiety] without agoraphobia: Secondary | ICD-10-CM

## 2019-10-09 ENCOUNTER — Other Ambulatory Visit: Payer: Self-pay | Admitting: Family Medicine

## 2019-10-12 ENCOUNTER — Ambulatory Visit: Payer: 59 | Attending: Family Medicine

## 2019-10-12 DIAGNOSIS — Z23 Encounter for immunization: Secondary | ICD-10-CM

## 2019-10-12 NOTE — Progress Notes (Signed)
Patient WAS wearing a surgical mask  Contact and Droplet precautions were followed when caring for the patient.   PPE used by provider during encounter: Surgical mask, Face Shield/Goggles and Gloves    The patient is eligible to receive the COVID-19 Vaccine at this time: Yes    The patient has received a COVID vaccine previously: No      The patient acknowledges receipt of the Fact Sheet for Recipients and Caregivers for the Moderna vaccine, which includes information about the risks and benefits of the vaccine and available alternatives. The patient was informed the FDA has authorized the emergency use of the COVID-19 Vaccine, which is not an FDA-approved vaccine and that the patient may accept or refuse the vaccine.    Pre-Screening Documentation:      COVID-19 Immunization Allergies  1. Have you ever had a severe allergic reaction to a previous dose of the COVID-19 Vaccine or to any ingredient of the COVID-19 Vaccine? (Review ingredients in the Fact Sheet for Recipients and Caregivers): No  2. Have you ever had an immediate allergic reaction of any severity within 4 hours of receiving a previous dose of the COVID-19 Vaccine or any of its ingredients?: No  3. Have you ever had an immediate allergic reaction of any severity to polysorbate or polyethylene glycol [PEG]?: No  Did the patient answer yes to any of questions 1-3?: No (proceed to question 4)      COVID-19 Immunization Clinical Considerations  4.Are you pregnant or breastfeeding?: No  5.Do you have a weakened immune system caused by something such as HIV infection or cancer, or do you take medications that affect the immune system?: No  6. Do you have a bleeding disorder or are you taking blood thinners? : No  Did the patient answer yes to any of questions 4-6? : No (proceed to question 7)      COVID-19 Immunization Deferral Considerations  7. Have you received any other vaccines within the past 14 days? : No  8. Have you tested positive for COVID-19 in  the last 14 days? : No  9. Have you received a monoclonal antibody or convalescent plasma treatment for COVID-19 in the past 90 days? : No  10. Have you had a confirmed COVID-19 exposure in the past 14 days?: No  11. Are you feeling sick today? : No  Did the patient answer yes to any of questions 7-11?: No (proceed to question 12)      COVID-19 Immunization Dermal Filler Consideration  12. Have you received a dermal filler injection?: Yes  If yes, the patient was informed this is not a contraindication to receiving the vaccine, but people who have received dermal fillers may have temporary swelling at or near the site of the injection.: The patient was encouraged to contact their physician if they develop swelling      COVID-19 Immunization Observation Considerations  13. Do you have a history of immediate allergic reaction of any severity to a vaccine?: No  14. Do you have a history of immediate allergic reaction of any severity to an injectable medication?: No  15. Do you have a history of severe allergic reaction due to any cause? : No  Did the patient answer yes to any of questions 13-15? : No  If no, the patient was informed a 15-minute observation period is recommended after the vaccine to watch for a reaction.: Confirmed        All patient questions were answered and the  patient agrees to receive the COVID-19 vaccine today.     Vaccine prepared and administered according to the current Emergency Use Authorization and Fact Sheet for Healthcare Providers Administering Vaccine.    Patient given vaccine card and discharge instructions with information about the Fact Sheet for Recipients and Caregivers and the v-safe program. Patient directed to observation area.     Megan Russell

## 2019-10-19 ENCOUNTER — Telehealth: Payer: Self-pay | Admitting: Family Medicine

## 2019-10-19 NOTE — Telephone Encounter (Signed)
Called patient and left VM to call back and schedule 2nd dose of vaccine     Megan Russell  [facility]  Patient Contact Center

## 2019-11-12 ENCOUNTER — Ambulatory Visit: Payer: 59 | Attending: Family Medicine

## 2019-11-12 DIAGNOSIS — Z23 Encounter for immunization: Secondary | ICD-10-CM

## 2019-11-12 NOTE — Progress Notes (Signed)
Patient WAS wearing a surgical mask  Contact and Droplet precautions were followed when caring for the patient.   PPE used by provider during encounter: Surgical mask, Face Shield/Goggles and Gloves    The patient is eligible to receive the COVID-19 Vaccine at this time: Yes    The patient has received a COVID vaccine previously: Yes   Immunization History   Administered Date(s) Administered    COVID-19 mRNA PF 100 mcg/0.5 mL (Moderna)   10/12/2019            The patient acknowledges receipt of the Fact Sheet for Recipients and Caregivers for the Moderna vaccine, which includes information about the risks and benefits of the vaccine and available alternatives. The patient was informed the FDA has authorized the emergency use of the COVID-19 Vaccine, which is not an FDA-approved vaccine and that the patient may accept or refuse the vaccine.    Pre-Screening Documentation:      COVID-19 Immunization Allergies  1. Have you ever had a severe allergic reaction to a previous dose of the COVID-19 Vaccine or to any ingredient of the COVID-19 Vaccine? (Review ingredients in the Fact Sheet for Recipients and Caregivers): No  2. Have you ever had an immediate allergic reaction of any severity within 4 hours of receiving a previous dose of the COVID-19 Vaccine or any of its ingredients?: No  Did the patient answer yes to any of questions 1-3?: No (proceed to question 4)      COVID-19 Immunization Clinical Considerations  4.Are you pregnant or breastfeeding?: No  5.Do you have a weakened immune system caused by something such as HIV infection or cancer, or do you take medications that affect the immune system?: No  6. Do you have a bleeding disorder or are you taking blood thinners? : No  Did the patient answer yes to any of questions 4-6? : No (proceed to question 7)      COVID-19 Immunization Deferral Considerations  7. Have you received any other vaccines within the past 14 days? : No  8. Have you had a confirmed COVID-19  exposure in the past 14 days?: No  9. Have you tested positive for COVID-19 in the last 14 days? : No  10. Are you feeling sick today? : No  Did the patient answer yes to any of questions 7-10?: No (proceed to question 11)       COVID-19 Immunization Antibody Deferral Considerations  11. In the past 90 days, have you tested positive for COVID-19 and recieved treatment?: No (proceed to question 12)       COVID-19 Immunization Dermal Filler Consideration  12. Have you received a dermal filler injection?: No (proceed to question 13)      COVID-19 Immunization Observation Considerations  13. Do you have a history of immediate allergic reaction of any severity to a vaccine?: No  14. Do you have a history of immediate allergic reaction of any severity to an injectable medication?: No  15. Do you have a history of severe allergic reaction due to any cause? : No  Did the patient answer yes to any of questions 13-15? : No        All patient questions were answered and the patient agrees to receive the COVID-19 vaccine today.     Vaccine prepared and administered according to the current Emergency Use Authorization and Fact Sheet for Healthcare Providers Administering Vaccine.    Patient given vaccine card and discharge instructions with information about the  Fact Sheet for Recipients and Caregivers and the v-safe program. Patient directed to observation area.     Emilio Aspen, RN

## 2019-11-13 ENCOUNTER — Other Ambulatory Visit: Payer: Self-pay | Admitting: Family Medicine

## 2019-11-13 DIAGNOSIS — F411 Generalized anxiety disorder: Secondary | ICD-10-CM

## 2019-11-13 DIAGNOSIS — F410 Panic disorder [episodic paroxysmal anxiety] without agoraphobia: Secondary | ICD-10-CM

## 2019-11-14 ENCOUNTER — Encounter: Payer: Self-pay | Admitting: Family Medicine

## 2019-11-14 ENCOUNTER — Telehealth: Payer: 59 | Admitting: Emergency Medicine

## 2019-11-14 DIAGNOSIS — Z760 Encounter for issue of repeat prescription: Secondary | ICD-10-CM

## 2019-11-14 MED ORDER — ALPRAZOLAM 1 MG TABLET
1.0000 mg | ORAL_TABLET | Freq: Two times a day (BID) | ORAL | 1 refills | Status: DC | PRN
Start: 2019-11-14 — End: 2020-04-02

## 2019-11-14 NOTE — Progress Notes (Signed)
Video Visit Note     I performed this clinical encounter by utilizing a real time telehealth video connection between my location and the patient's location. The patient's location was confirmed during this visit. I obtained verbal consent from the patient to perform this clinical encounter utilizing video and prepared the patient by answering any questions they had about the telehealth interaction.    I spent a total of today on this patient's visit. This included 15 minutes of face to face time, more than 50% of which was spent in counseling or coordinating care, for the following medical problem(s) medication refill. The remainder of the time was spent on review of the patient record (including but not limited to clinical notes, outside records, laboratory and radiographic studies), medical decision-making, and documentation of the visit.     29 yo F requesting medication refill.  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  Only has 1 pill left currently from prior prescription in Jan.      Plan: Reviewed CURES and no suggestion of abuse.  Rx provided to patient, sent to pharmacy and advised about adverse risks regarding medication.       Electronically signed by: Mabeline Caras. Galvin Proffer, MD   Attending Physician  Professor  Department of Emergency Medicine

## 2019-11-14 NOTE — Telephone Encounter (Signed)
From: Abigail Butts  To: [facility]  Sent: 11/14/2019 6:55 AM PDT  Subject: MyChart Refill Request    Good morning. Through my pharmacy I put in a request for a refill on my prescription for alprazolam, I do not have the option to request via the app but also wanted to send a message here :)

## 2019-11-16 NOTE — Telephone Encounter (Signed)
Duplicate request

## 2020-01-16 ENCOUNTER — Other Ambulatory Visit: Payer: Self-pay | Admitting: Family Medicine

## 2020-01-16 DIAGNOSIS — F32A Depression, unspecified: Secondary | ICD-10-CM

## 2020-01-16 DIAGNOSIS — F410 Panic disorder [episodic paroxysmal anxiety] without agoraphobia: Secondary | ICD-10-CM

## 2020-01-16 DIAGNOSIS — F411 Generalized anxiety disorder: Secondary | ICD-10-CM

## 2020-03-27 ENCOUNTER — Other Ambulatory Visit: Payer: Self-pay | Admitting: Family Medicine

## 2020-03-27 NOTE — Telephone Encounter (Signed)
Controlled Substance:    VV pt wants VV for med refill and follow up declined to come for F2F       pt requested 30 day supply   send to preferred pharmacy    [facility]:  Please inform that all medication requests should be made 3 business days in advance.  Advise patient once requested to allow for 48-72 business hours for processing.  Verify that the prescription was not already called in.  Route to MA pool.  MA:  Sort medication history list by date, select the last prescribed version of the medication. Pend refill and route to Provider.  If message is URGENT, MA is to notify Provider face to face about the request].     3 demographics verified   Megan Russell  [facility]  Patient Contact Center   Ph# [phone]

## 2020-03-27 NOTE — Telephone Encounter (Signed)
Requested Prescriptions     Alprazolam (XANAX) 1 mg Tablet         Sig: Take 1 tablet by mouth every 12 hours if needed for anxiety (for flying).    Disp:  Not specified  Refills:      Start: 03/27/2020 - 09/23/2020    Class: Pharmacy    Last ordered: 4 months ago by Valetta Fuller, MD

## 2020-03-27 NOTE — Telephone Encounter (Signed)
Patient requesting Xanax refill for flying. CURES report shows last administered 20tab in June 2021 and an additional 20tab in March 2021. Would like to discuss meds use in appt prior to future refills. Has an appt with Dr Marianna Payment next week.    Lurline Idol, MD  Associate Physician  [facility] Community [facility]  [facility]

## 2020-03-28 ENCOUNTER — Ambulatory Visit: Payer: 59 | Admitting: FAMILY PRACTICE

## 2020-04-02 ENCOUNTER — Ambulatory Visit: Payer: 59 | Admitting: Student in an Organized Health Care Education/Training Program

## 2020-04-02 DIAGNOSIS — F411 Generalized anxiety disorder: Secondary | ICD-10-CM

## 2020-04-02 DIAGNOSIS — F32A Depression, unspecified: Secondary | ICD-10-CM

## 2020-04-02 DIAGNOSIS — F329 Major depressive disorder, single episode, unspecified: Secondary | ICD-10-CM

## 2020-04-02 DIAGNOSIS — F410 Panic disorder [episodic paroxysmal anxiety] without agoraphobia: Secondary | ICD-10-CM

## 2020-04-02 MED ORDER — ALPRAZOLAM 1 MG TABLET
1.0000 mg | ORAL_TABLET | Freq: Three times a day (TID) | ORAL | 0 refills | Status: AC | PRN
Start: 2020-04-02 — End: 2020-04-12

## 2020-04-02 NOTE — Progress Notes (Signed)
I did not personally see this patient, but I reviewed and discussed the patient with the Resident and agree with the Resident's findings and plan we developed, as outlined in the resident's note.    Report Electronically Signed by:  Doneshia Hill, MD  Professor and Chair  Department of Family & Community Medicine  PI # [id number]  Pager [phone]  '

## 2020-04-02 NOTE — Progress Notes (Signed)
Video Visit Note     I performed this clinical encounter by utilizing a real time telehealth video connection between my location and the patient's location. The patient's location was confirmed during this visit. I obtained verbal consent from the patient to perform this clinical encounter utilizing video and prepared the patient by answering Megan questions they had about the telehealth interaction.    I spent a total of 16 minutes today on this patient's visit.    Megan Russell If you are reviewing this progress note and have questions about the meaning or medical terms being used, please schedule an appointment or bring it up at your next follow-up appointment.  Medical notes are a communication tool between medical professionals and require medical terms to be used for efficiency. You can also look up terms via on-line medical dictionaries such as Medline Plus at [employer].it.html     Family Medicine Clinic Note  Note: Assessment and plan have been moved to the top of the note for convenience.     04/02/20    ASSESSMENT and PLAN  Megan Russell is a 29yr old female who presents for follow up on    1. GAD (generalized anxiety disorder)  2. 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  CURES reviewed, without aberrancy or concern for abuse.  Remains on sertraline with baseline anxiety in better control.  Expresses understanding that Megan Russell will not continue to prescribe this medication after she moves to [place].  - Alprazolam (XANAX) 1 mg Tablet; Take 1 tablet by mouth three times daily if needed for anxiety (for flying).  Dispense: 20 tablet; Refill: 0    3. Depression, unspecified depression type  Significantly improved since starting sertraline last year.  Continue sertraline 50mg  daily.  Endorses adequate supply to last through transition to new PCP following upcoming move to [facility].    Follow-up:   As needed.    Assessment and Plan discussed with Dr. [facility], MD   [facility]  PGY-2, [facility] & Community Medicine  PI: [phone]        I did review patient's past medical and family/social history: No changes noted.  Patient was counseled specifically on the following: counseled on prognosis, instructions for management and/or follow-up and risk factor reduction.  Patient verbalized understanding of teaching and instructions.       History of Present Illness:  Megan Russell is a 29yr old female   who presents with chief complaint of   Chief Complaint   Patient presents with    Anxiety       Starting to fly more  Would like more alprazolam  Took last 2 pills to get to 27yr  Last fill in June  Has flown quite a bit lately    Notices most anxiety when she is in situations where she is unable to escape a situation, feels the fight or flight experience build  Overall feels like her day to day anxiety is better  Working on organization  Taking sertraline     Feels overwhelmed still, no longer feeling imminent doom feeling  No longer feeling like a chore to get out of bed  Less irritable overall - family has noticed shift  Able to take a step back  Knows that the things that stress her out the   Starting running and working on fitness    Was  scheduled with psychologist but backed up and never established  Plans to establish in [place]    Within the last few weeks a lot has changed  Lots of life changes  Relocating to [place]    Getting married this weekend  Currently in [place] for her wedding    Answers for HPI/ROS submitted by the patient on 04/02/2020  PHQ-2 Score:: 0  PHQ-9 Score: : 3    All systems were reviewed and are negative except as documented in HPI    Patient Active Problem List    Diagnosis Date Noted    Seasonal allergies 07/05/2018       Current Outpatient Medications   Medication Sig    Alprazolam (XANAX) 1 mg Tablet Take 1 tablet by mouth  three times daily if needed for anxiety (for flying).    Cetirizine (ZYRTEC) 10 mg Tablet Take 1 tablet by mouth once daily if needed for Allergies.    Fluticasone (FLONASE) 50 mcg/actuation nasal spray Instill 1-2 sprays into EACH nostril every day. (allergies)    Levonorgestrel 0.1 mg/Ethinyl Estradiol 20 mcg (ALESSE, AVIANE, LESSINA) Tablet TAKE 1 TABLET BY MOUTH EVERY DAY    Sertraline (ZOLOFT) 25 mg Tablet Take 1 tablet by mouth every day for 7 days. (Start 50 mg dose after finishing 25 mg dose.)    Sertraline (ZOLOFT) 50 mg Tablet Take 1 tablet by mouth every morning.    Triamcinolone (KENALOG) 0.1 % Cream Apply 30 g to the affected area 2 times daily. (rash)     No current facility-administered medications for this visit.       I did review patient's past medical and family/social history, no changes noted.     OBJECTIVE:   There were no vitals taken for this visit.     Pertinent Physical Exam Findings:  General: No acute distress, nontoxic appearing  HEENT: No evidence of trauma, clear conjunctivae, EOMI, appearance of oropharynx on video does not reveal erythema  Resp: breathing comfortably, speaking in full sentences.  Skin: no apparent lesions noted.  Psych: appropriate, alert, engaged, mood is upbeat and affect sounds bright yet anxious        No results found for: HGBA1C  Lab Results   Lab Name Value Date/Time    WBC 5.5 06/03/2018 02:12 PM    HGB 14.3 06/03/2018 02:12 PM    HCT 41.8 06/03/2018 02:12 PM    PLT 168 06/03/2018 02:12 PM

## 2020-06-16 ENCOUNTER — Other Ambulatory Visit: Payer: Self-pay | Admitting: Family Medicine

## 2020-06-17 NOTE — Telephone Encounter (Signed)
Requested Prescriptions     Name from pharmacy: SRONYX 0.10-0.02 MG TABLET         Will file in chart as: SRONYX 0.1-20 mg-mcg Tablet    Sig: TAKE 1 TABLET BY MOUTH EVERY DAY    Disp:  84 tablet  Refills:  1    Start: 06/16/2020    Class: Pharmacy    Last ordered: 1 year ago by Dia Sitter, MD Last refill: 03/26/2020    Rx #: [id number]

## 2020-07-29 ENCOUNTER — Ambulatory Visit: Payer: Self-pay | Admitting: Dermatology

## 2020-12-13 ENCOUNTER — Emergency Department
Admission: EM | Admit: 2020-12-13 | Discharge: 2020-12-13 | Discharge status: Against Medical Advice | Payer: No Typology Code available for payment source | Attending: Emergency Medicine | Admitting: Emergency Medicine

## 2020-12-13 ENCOUNTER — Other Ambulatory Visit: Payer: Self-pay

## 2020-12-13 ENCOUNTER — Emergency Department: Payer: No Typology Code available for payment source

## 2020-12-13 ENCOUNTER — Encounter: Payer: Self-pay | Admitting: Emergency Medicine

## 2020-12-13 DIAGNOSIS — R112 Nausea with vomiting, unspecified: Secondary | ICD-10-CM | POA: Insufficient documentation

## 2020-12-13 DIAGNOSIS — R1110 Vomiting, unspecified: Secondary | ICD-10-CM

## 2020-12-13 LAB — COMPREHENSIVE METABOLIC PANEL
ALT: 20 U/L (ref 0–44)
AST: 29 U/L (ref 15–41)
Albumin: 4.9 g/dL (ref 3.5–5.0)
Alkaline Phosphatase: 35 U/L — ABNORMAL LOW (ref 38–126)
Anion gap: 12 (ref 5–15)
BUN: 15 mg/dL (ref 6–20)
CO2: 21 mmol/L — ABNORMAL LOW (ref 22–32)
Calcium: 9.8 mg/dL (ref 8.9–10.3)
Chloride: 106 mmol/L (ref 98–111)
Creatinine, Ser: 0.98 mg/dL (ref 0.44–1.00)
GFR, Estimated: >60 mL/min (ref >60)
Glucose, Bld: 112 mg/dL — ABNORMAL HIGH (ref 70–99)
Potassium: 4 mmol/L (ref 3.5–5.1)
Sodium: 139 mmol/L (ref 135–145)
Total Bilirubin: 1.7 mg/dL — ABNORMAL HIGH (ref 0.3–1.2)
Total Protein: 8 g/dL (ref 6.5–8.1)

## 2020-12-13 LAB — URINALYSIS, COMPLETE (UACMP) WITH MICROSCOPIC
Bilirubin Urine: NEGATIVE
Glucose, UA: NEGATIVE mg/dL
Hgb urine dipstick: NEGATIVE
Ketones, ur: 80 mg/dL — AB
Nitrite: NEGATIVE
Protein, ur: NEGATIVE mg/dL
Specific Gravity, Urine: 1.025 (ref 1.005–1.030)
pH: 7 (ref 5.0–8.0)

## 2020-12-13 LAB — URINE DRUG SCREEN, QUALITATIVE (ARMC ONLY)
Amphetamines, Ur Screen: NOT DETECTED
Barbiturates, Ur Screen: NOT DETECTED
Benzodiazepine, Ur Scrn: NOT DETECTED
Cannabinoid 50 Ng, Ur ~~LOC~~: POSITIVE — AB
Cocaine Metabolite,Ur ~~LOC~~: NOT DETECTED
MDMA (Ecstasy)Ur Screen: NOT DETECTED
Methadone Scn, Ur: NOT DETECTED
Opiate, Ur Screen: NOT DETECTED
Phencyclidine (PCP) Ur S: NOT DETECTED
Tricyclic, Ur Screen: NOT DETECTED

## 2020-12-13 LAB — CBC
HCT: 42.2 % (ref 36.0–46.0)
Hemoglobin: 14.9 g/dL (ref 12.0–15.0)
MCH: 31.7 pg (ref 26.0–34.0)
MCHC: 35.3 g/dL (ref 30.0–36.0)
MCV: 89.8 fL (ref 80.0–100.0)
Platelets: 200 10*3/uL (ref 150–400)
RBC: 4.7 MIL/uL (ref 3.87–5.11)
RDW: 11.8 % (ref 11.5–15.5)
WBC: 10.3 10*3/uL (ref 4.0–10.5)
nRBC: 0 % (ref 0.0–0.2)

## 2020-12-13 LAB — LIPASE, BLOOD: Lipase: 34 U/L (ref 11–51)

## 2020-12-13 LAB — HCG, QUANTITATIVE, PREGNANCY: hCG, Beta Chain, Quant, S: <1 m[IU]/mL (ref <5)

## 2020-12-13 LAB — CK: Total CK: 150 U/L (ref 38–234)

## 2020-12-13 MED ORDER — ONDANSETRON HCL 4 MG/2ML IJ SOLN: 4.0000 mg | Freq: Once | INTRAMUSCULAR | Status: AC

## 2020-12-13 MED ORDER — ONDANSETRON HCL 4 MG/2ML IJ SOLN
INTRAMUSCULAR | Status: AC
  Administered 2020-12-13: 4 mg via INTRAVENOUS
  Filled 2020-12-13: qty 2

## 2020-12-13 MED ORDER — PROCHLORPERAZINE EDISYLATE 10 MG/2ML IJ SOLN
10.0000 mg | Freq: Once | INTRAMUSCULAR | Status: AC
  Administered 2020-12-13: 10 mg via INTRAVENOUS
  Filled 2020-12-13: qty 2

## 2020-12-13 MED ORDER — PROCHLORPERAZINE MALEATE 5 MG PO TABS
5.0000 mg | ORAL_TABLET | Freq: Once | ORAL | Status: DC
  Filled 2020-12-13: qty 1

## 2020-12-13 MED ORDER — LACTATED RINGERS IV BOLUS
1000.0000 mL | Freq: Once | INTRAVENOUS | Status: AC
  Administered 2020-12-13: 1000 mL via INTRAVENOUS

## 2020-12-13 NOTE — ED Triage Notes (Signed)
Pt states that she was running and when she got done she started dry heaving. Pt states that she did not eat or drink before she worked out. Pt states that she has had this same thing happen before. Pt states that she is having cold sweats as well. Pt is in NAD.

## 2020-12-13 NOTE — ED Notes (Signed)
Pt given warm blanket.

## 2020-12-13 NOTE — Discharge Instructions (Addendum)
Return to the ER if you wish to continue your medical evaluation and care or if you experience any worsening.

## 2020-12-13 NOTE — ED Notes (Signed)
Pt husband to the front desk from outside stating that pt called him and said that no one has checked on her for over 2 hours. Pts husband informed that pt has been rounded on in the last hour and vital signs had been updated. Pt husband verbalized understanding and returned outside.

## 2020-12-13 NOTE — ED Provider Notes (Signed)
[facility] Emergency Department Provider Note  ____________________________________________   Event Date/Time   First MD Initiated Contact with Patient 12/13/20 1827     (approximate)  I have reviewed the triage vital signs and the nursing notes.   HISTORY  Chief Complaint Nausea   HPI Jennifer Snow is a 30 y.o. female who reports to the emergency department for evaluation of nausea and vomiting.  Patient states that she went to the gym to workout today and afterwards started having nausea and vomiting that she has not been able to stop.  She reports this is happened to her in the past when she worked out without eating anything prior. She denies hematemesis and does not endorse bilious-type vomit. She denies any fever, abdominal pain, recent illness.       History reviewed. No pertinent past medical history.  There are no problems to display for this patient.   History reviewed. No pertinent surgical history.  Prior to Admission medications   Not on File    Allergies Patient has no known allergies.  No family history on file.  Social History Social History   Substance Use Topics  . Alcohol use: Not Currently    Review of Systems Constitutional: No fever/chills Eyes: No visual changes. ENT: No sore throat. Cardiovascular: Denies chest pain. Respiratory: Denies shortness of breath. Gastrointestinal: No abdominal pain.  + nausea, + vomiting.  No diarrhea.  No constipation. Genitourinary: Negative for dysuria. Musculoskeletal: Negative for back pain. Skin: Negative for rash. Neurological: Negative for headaches, focal weakness or numbness.  ____________________________________________   PHYSICAL EXAM:  VITAL SIGNS: ED Triage Vitals  Enc Vitals Group     BP 12/13/20 1458 (!) 138/95     Pulse Rate 12/13/20 1454 81     Resp 12/13/20 1454 16     Temp 12/13/20 1454 98 F (36.7 C)     Temp Source 12/13/20 1454 Oral     SpO2  12/13/20 1454 100 %     Weight 12/13/20 1455 130 lb (59 kg)     Height 12/13/20 1455 5\' 8"  (1.727 m)     Head Circumference --      Peak Flow --      Pain Score 12/13/20 1455 0     Pain Loc --      Pain Edu? --      Excl. in GC? --    Constitutional: Ill-appearing female, seated in a fetal position. Eyes: Conjunctivae are normal. PERRL. EOMI. Head: Atraumatic. Nose: No congestion/rhinnorhea. Mouth/Throat: Mucous membranes are moist.  Neck: No stridor.   Cardiovascular: Normal rate, regular rhythm. Grossly normal heart sounds.  Good peripheral circulation. Respiratory: Normal respiratory effort.  No retractions. Lungs CTAB. Gastrointestinal: Soft and nontender.  Palpation of the right upper quadrant and epigastric region worsens reported nausea.  No distention. No abdominal bruits. No CVA tenderness. Musculoskeletal: No lower extremity tenderness nor edema.  No joint effusions. Neurologic:  Normal speech and language. No gross focal neurologic deficits are appreciated. No gait instability. Skin:  Skin is warm, dry and intact. No rash noted. Psychiatric: Mood and affect are normal. Speech and behavior are normal.  ____________________________________________   LABS (all labs ordered are listed, but only abnormal results are displayed)  Labs Reviewed  COMPREHENSIVE METABOLIC PANEL - Abnormal; Notable for the following components:      Result Value   CO2 21 (*)    Glucose, Bld 112 (*)    Alkaline Phosphatase 35 (*)  Total Bilirubin 1.7 (*)    All other components within normal limits  LIPASE, BLOOD  CBC  URINALYSIS, COMPLETE (UACMP) WITH MICROSCOPIC  URINE DRUG SCREEN, QUALITATIVE ([facility] ONLY)  HCG, QUANTITATIVE, PREGNANCY  CK  POC URINE PREG, ED    ____________________________________________   INITIAL IMPRESSION / ASSESSMENT AND PLAN / ED COURSE  As part of my medical decision making, I reviewed the following data within the electronic MEDICAL RECORD NUMBER Nursing notes  reviewed and incorporated, Labs reviewed and Notes from prior ED visits        Patient is a 30 year old female who presents to the emergency department for evaluation of nausea and vomiting that she has not been able to control since a workout earlier today.  She reports her last p.o. was 24 hours ago and she is "running on fumes".  See HPI for further details.  I initially evaluated the patient in the subwait area of the emergency department due to capacity limitations state the patient a normal bed.  She was seated in the fetal position still dry heaving despite 1 L of fluids and 4 mg of IV Zofran.  She is nontender to palpation of the abdomen but states that palpation of her right upper quadrant makes her nausea worse.  Initial labs show a normal lipase, normal CBC and CMP only remarkable for a total bili of 1.7.  The patient has not yet been able to produce a urine.  Given her continued vomiting despite Zofran, I provided the patient a dose of Compazine.  This was around the same time that she was moved to a normal room where I again evaluated her.  After her dose of Compazine, she was seated in a more relaxed position and I again examined her abdomen with no peritoneal signs but again reported worsening of her nausea with palpation of the right upper quadrant.  5 minutes later I was notified by the nurse that the patient would like to leave.  I went and had a discussion with the patient and her husband about her right to leave if she chooses, however stressing the concerns for leaving and potential worsening if she chose to leave prior to her medical work-up being complete.  She voices understanding and is still stating that she would like to leave, stating that she is uncomfortable here, the bed makes her uncomfortable and that she would rest better at home.  I ensured the patient that we are always here for her if she should wish to return for completion of her evaluation or if she worsens.   Patient vocalizes understanding.  The nurse will have the patient signed out AMA.      ____________________________________________   FINAL CLINICAL IMPRESSION(S) / ED DIAGNOSES  Final diagnoses:  Vomiting     ED Discharge Orders    None      *Please note:  NICO ROGNESS was evaluated in Emergency Department on 12/13/2020 for the symptoms described in the history of present illness. She was evaluated in the context of the global COVID-19 pandemic, which necessitated consideration that the patient might be at risk for infection with the SARS-CoV-2 virus that causes COVID-19. Institutional protocols and algorithms that pertain to the evaluation of patients at risk for COVID-19 are in a state of rapid change based on information released by regulatory bodies including the CDC and federal and state organizations. These policies and algorithms were followed during the patient's care in the ED.  Some ED evaluations and interventions may  be delayed as a result of limited staffing during and the pandemic.*   Note:  This document was prepared using Dragon voice recognition software and may include unintentional dictation errors.   Lucy Chris, PA 12/13/20 Serena Croissant    Minna Antis, MD 12/13/20 2325

## 2020-12-13 NOTE — ED Notes (Signed)
Husband Info [phone]

## 2022-03-18 ENCOUNTER — Encounter: Payer: Self-pay | Admitting: PSYCHIATRY
